# Patient Record
Sex: Female | Born: 1945 | Smoking: Former smoker
Health system: Southern US, Community
[De-identification: ages and names within clinical notes are randomized; demographics above are authoritative.]

## PROBLEM LIST (undated history)

## (undated) DIAGNOSIS — K209 Esophagitis, unspecified without bleeding: Secondary | ICD-10-CM

## (undated) DIAGNOSIS — Z86010 Personal history of colonic polyps: Secondary | ICD-10-CM

## (undated) DIAGNOSIS — H269 Unspecified cataract: Secondary | ICD-10-CM

## (undated) DIAGNOSIS — K5790 Diverticulosis of intestine, part unspecified, without perforation or abscess without bleeding: Secondary | ICD-10-CM

## (undated) DIAGNOSIS — K219 Gastro-esophageal reflux disease without esophagitis: Secondary | ICD-10-CM

## (undated) DIAGNOSIS — K649 Unspecified hemorrhoids: Secondary | ICD-10-CM

## (undated) DIAGNOSIS — K449 Diaphragmatic hernia without obstruction or gangrene: Secondary | ICD-10-CM

## (undated) HISTORY — DX: Unspecified cataract: H26.9

## (undated) HISTORY — PX: APPENDECTOMY: SHX54

## (undated) HISTORY — DX: Diverticulosis of intestine, part unspecified, without perforation or abscess without bleeding: K57.90

## (undated) HISTORY — DX: Personal history of colonic polyps: Z86.010

## (undated) HISTORY — PX: UPPER GASTROINTESTINAL ENDOSCOPY: SHX188

## (undated) HISTORY — DX: Unspecified hemorrhoids: K64.9

## (undated) HISTORY — DX: Diaphragmatic hernia without obstruction or gangrene: K44.9

## (undated) HISTORY — PX: TONSILLECTOMY AND ADENOIDECTOMY: SUR1326

## (undated) HISTORY — DX: Gastro-esophageal reflux disease without esophagitis: K21.9

## (undated) HISTORY — DX: Esophagitis, unspecified: K20.9

## (undated) HISTORY — DX: Esophagitis, unspecified without bleeding: K20.90

---

## 2011-04-20 ENCOUNTER — Encounter: Payer: Self-pay | Admitting: Internal Medicine

## 2011-04-25 ENCOUNTER — Encounter: Payer: Self-pay | Admitting: Internal Medicine

## 2011-04-25 ENCOUNTER — Ambulatory Visit (INDEPENDENT_AMBULATORY_CARE_PROVIDER_SITE_OTHER): Payer: Medicare Other | Admitting: Internal Medicine

## 2011-04-25 DIAGNOSIS — R1310 Dysphagia, unspecified: Secondary | ICD-10-CM

## 2011-04-25 DIAGNOSIS — R1013 Epigastric pain: Secondary | ICD-10-CM

## 2011-04-25 DIAGNOSIS — Z86010 Personal history of colonic polyps: Secondary | ICD-10-CM

## 2011-04-25 DIAGNOSIS — K219 Gastro-esophageal reflux disease without esophagitis: Secondary | ICD-10-CM

## 2011-04-25 MED ORDER — PEG-KCL-NACL-NASULF-NA ASC-C 100 G PO SOLR: 1.0000 | Freq: Once | ORAL | Status: DC

## 2011-04-25 NOTE — Progress Notes (Signed)
HISTORY OF PRESENT ILLNESS:  Brianna Trujillo is a 65 y.o. female with no significant past medical history who presents today with new-onset swallowing issues and dyspeptic symptoms. Patient reports developing a "choking sensation" about 2 weeks ago. Associated with this was pyrosis and epigastric discomfort. She does have problems with heartburn intermittently, over the years, for which she takes an [place]a product or consumes celery. The pyrosis and epigastric discomfort have resolved. However, difficulties with initiating swallowing, have continued. She does say that she had similar problems years ago, and was told that she may be developing a goiter. She's also had upper endoscopy and colonoscopy approximately 8 years ago. We do not have copies of those reports. She is not sure why she underwent upper endoscopy, but cannot recall any abnormalities. She states that colonoscopy had colon polyp or polyps in that she is due for followup. She denies lower GI complaints. She has had no bleeding or weight loss. No family history of colon cancer.  REVIEW OF SYSTEMS:  All non-GI ROS negative entirely.  Past Medical History  Diagnosis Date  . History of colon polyps     Past Surgical History  Procedure Date  . Appendectomy   . Tonsillectomy and adenoidectomy     Social History Brianna Trujillo  reports that she has quit smoking. She does not have any smokeless tobacco history on file. She reports that she drinks alcohol. She reports that she does not use illicit drugs.  family history includes Breast cancer in an unspecified family member; Colon cancer in her paternal uncle; Colon polyps in her mother; Diabetes in her father; Heart disease in her father and mother; and Leukemia in her paternal uncle.  No Known Allergies     PHYSICAL EXAMINATION: Vital signs: BP 130/70  Pulse 80  Ht 5\' 3"  (1.6 m)  Wt 148 lb 3.2 oz (67.223 kg)  BMI 26.25 kg/m2  Constitutional: generally well-appearing, no  acute distress Psychiatric: alert and oriented x3, cooperative Eyes: extraocular movements intact, anicteric, conjunctiva pink Mouth: oral pharynx moist, no lesions Neck: supple no lymphadenopathy Cardiovascular: heart regular rate and rhythm, no murmur Lungs: clear to auscultation bilaterally Abdomen: soft, nontender, nondistended, no obvious ascites, no peritoneal signs, normal bowel sounds, no organomegaly Rectal: Deferred until colonoscopy Extremities: no lower extremity edema bilaterally Skin: no lesions on visible extremities Neuro: No focal deficits.   ASSESSMENT:  #1. Vague complaints of swallowing difficulty. Rule out anatomic abnormality. #2. GERD #3. Transient epigastric discomfort likely related to GERD #4. History of colon polyps (type unknown). Exam at least 8 years ago. Reports to be due for followup.   PLAN:   #1. Obtain outside records for review, if possible #2. Schedule upper endoscopy to evaluate vague dysphagia and colonoscopy to provide colon polyp surveillance.The nature of the procedure, as well as the risks, benefits, and alternatives were carefully and thoroughly reviewed with the patient. Ample time for discussion and questions allowed. The patient understood, was satisfied, and agreed to proceed. Movi prep prescribed. The patient instructed on its use #3. Reflux precautions and reflux diet. Literature provided as well as brochures. #4. Discussed the role of on demand PPI for stubborn heartburn.

## 2011-04-25 NOTE — Patient Instructions (Signed)
Colon/endo [facility] 05/09/11 3:30 pm arrive at 2:30 pm on 4th floor Moviprep sent to pharmacy Colon/endo brochure given to you to read.

## 2011-04-28 HISTORY — PX: COLONOSCOPY: SHX174

## 2011-05-09 ENCOUNTER — Ambulatory Visit (AMBULATORY_SURGERY_CENTER): Payer: Medicare Other | Admitting: Internal Medicine

## 2011-05-09 ENCOUNTER — Encounter: Payer: Self-pay | Admitting: Internal Medicine

## 2011-05-09 VITALS — BP 131/72 | HR 77 | Temp 97.4°F | Resp 21 | Ht 64.0 in | Wt 143.0 lb

## 2011-05-09 DIAGNOSIS — R1310 Dysphagia, unspecified: Secondary | ICD-10-CM

## 2011-05-09 DIAGNOSIS — Z1211 Encounter for screening for malignant neoplasm of colon: Secondary | ICD-10-CM

## 2011-05-09 DIAGNOSIS — R1013 Epigastric pain: Secondary | ICD-10-CM

## 2011-05-09 DIAGNOSIS — K219 Gastro-esophageal reflux disease without esophagitis: Secondary | ICD-10-CM

## 2011-05-09 DIAGNOSIS — Z86010 Personal history of colonic polyps: Secondary | ICD-10-CM

## 2011-05-09 MED ORDER — SODIUM CHLORIDE 0.9 % IV SOLN: 500.0000 mL | INTRAVENOUS | Status: DC

## 2011-05-09 MED ORDER — OMEPRAZOLE 40 MG PO CPDR: 40.0000 mg | DELAYED_RELEASE_CAPSULE | Freq: Every day | ORAL | Status: DC

## 2011-05-09 NOTE — Patient Instructions (Addendum)
Please refer to your blue and neon green sheets for instructions regarding diet and activity for the rest of today.  You may resume your medications as you would normally take them. Your omeprazole prescription was sent in to your pharmacy.  Hemorrhoids Hemorrhoids are dilated (enlarged) veins around the rectum. Sometimes clots will form in the veins. This makes them swollen and painful. These are called thrombosed hemorrhoids. Causes of hemorrhoids include:  Pregnancy: this increases the pressure in the hemorrhoidal veins.   Constipation.   Straining to have a bowel movement.  HOME CARE INSTRUCTIONS  Eat a well balanced diet and drink 6 to 8 glasses of water every day to avoid constipation. You may also use a bulk laxative.   Avoid straining to have bowel movements.   Keep anal area dry and clean.   Only take over-the-counter or prescription medicines for pain, discomfort, or fever as directed by your caregiver.  If thrombosed:  Take hot sitz baths for 20 to 30 minutes, 3 to 4 times per day.   If the hemorrhoids are very tender and swollen, place ice packs on area as tolerated. Using ice packs between sitz baths may be helpful. Fill a plastic bag with ice and use a towel between the bag of ice and your skin.   Special creams and suppositories (Anusol, Nupercainal, Wyanoids) may be used or applied as directed.   Do not use a donut shaped pillow or sit on the toilet for long periods. This increases blood pooling and pain.   Move your bowels when your body has the urge; this will require less straining and will decrease pain and pressure.   Only take over-the-counter or prescription medicines for pain, discomfort, or fever as directed by your caregiver.  SEEK MEDICAL CARE IF:  You have increasing pain and swelling that is not controlled with your prescription.   You have uncontrolled bleeding.   You have an inability or difficulty having a bowel movement.   You have pain or  inflammation outside the area of the hemorrhoids.   You have chills and/or an increased oral temperature that lasts for 2 days or longer, or as your caregiver suggests.  MAKE SURE YOU:   Understand these instructions.   Will watch your condition.   Will get help right away if you are not doing well or get worse.  Document Released: 08/10/2000 Document Re-Released: 07/26/2008 [facility] Patient Information 2011 [place], [place].   Diverticulosis Diverticulosis is a common condition that develops when small pouches (diverticula) form in the wall of the colon. The risk of diverticulosis increases with age. It happens more often in people who eat a low-fiber diet. Most individuals with diverticulosis have no symptoms. Those individuals with symptoms usually experience belly (abdominal) pain, constipation, or loose stools (diarrhea). HOME CARE INSTRUCTIONS  Increase the amount of fiber in your diet as directed by your caregiver or dietician. This may reduce symptoms of diverticulosis.   Your caregiver may recommend taking a dietary fiber supplement.   Drink at least 6 to 8 glasses of water each day to prevent constipation.   Try not to strain when you have a bowel movement.   Your caregiver may recommend avoiding nuts and seeds to prevent complications, although this is still an uncertain benefit.   Only take over-the-counter or prescription medicines for pain, discomfort, or fever as directed by your caregiver.  FOODS HAVING HIGH FIBER CONTENT INCLUDE:  Fruits. Apple, peach, pear, tangerine, raisins, prunes.   Vegetables. Brussels sprouts, asparagus,  broccoli, cabbage, carrot, cauliflower, romaine lettuce, spinach, summer squash, tomato, winter squash, zucchini.   Starchy Vegetables. Baked beans, kidney beans, lima beans, split peas, lentils, potatoes (with skin).   Grains. Whole wheat bread, brown rice, bran flake cereal, plain oatmeal, white rice, shredded wheat, bran muffins.  SEEK  IMMEDIATE MEDICAL CARE IF:  You develop increasing pain or severe bloating.   You have an increased oral temperature, not controlled by medicine.   You develop vomiting or bowel movements that are bloody or black.  Document Released: 05/10/2004 Document Re-Released: 01/31/2010 [facility] Patient Information 2011 [place], [place].  Hiatal Hernia A hiatal hernia occurs when a part of the stomach slides above the diaphragm. The diaphragm is the thin muscle separating the belly (abdomen) from the chest. A hiatal hernia can be something you are born with or develop over time. Hiatal hernias may allow stomach acid to flow back into your esophagus, the tube which carries food from your mouth to your stomach. If this acid causes problems it is called GERD (gastro-esophageal reflux disease).  SYMPTOMS Common symptoms of GERD are heartburn (burning in your chest). This is worse when lying down or bending over. It may also cause belching and indigestion. Some of the things which make GERD worse are:  Increased weight pushes on stomach making acid rise more easily.   Smoking markedly increases acid production.   Alcohol decreases lower esophageal sphincter pressure (valve between stomach and esophagus), allowing acid from stomach into esophagus.   Late evening meals and going to bed with a full stomach increases pressure.   Anything that causes an increase in acid production.   Lower esophageal sphincter incompetence.  DIAGNOSIS Hiatal hernia is often diagnosed with x-rays of your stomach and small bowel. This is called an UGI (upper gastrointestinal x-ray). Sometimes a gastroscopic procedure is done. This is a procedure where your caregiver uses a flexible instrument to look into the stomach and small bowel. HOME CARE INSTRUCTIONS  Try to achieve and maintain an ideal body weight.   Avoid drinking alcoholic beverages.   Stop smoking.   Put the head of your bed on 4 to 6 inch blocks. This will  keep your head and esophagus higher than your stomach. If you cannot use blocks, sleep with several pillows under your head and shoulders.   Over-the-counter medications will decrease acid production. Your caregiver can also prescribe medications for this. Take as directed.   1/2 to 1 teaspoon of an antacid taken every hour while awake, with meals and at bedtime, will neutralize acid.   DO NOT take aspirin, ibuprofen (Advil or Motrin), or other nonsteroidal anti-inflammatory drugs.   Do not wear tight clothing around your chest or stomach.   Eat smaller meals and eat more frequently. This keeps your stomach from getting too full. Eat slowly.   Do not lie down for 2 or 3 hours after eating. Do not eat or drink anything 1 to 2 hours before going to bed.   Avoid caffeine beverages (colas, coffee, cocoa, tea), fatty foods, citrus fruits and all other foods and drinks that contain acid and that seem to increase the problems.   Avoid bending over, especially after eating. Also avoid straining during bowel movements or when urinating or lifting things. Anything that increases the pressure in your belly increases the amount of acid that may be pushed up into your esophagus.  SEEK IMMEDIATE MEDICAL ATTENTION IF:  There is change in location (pain in arms, neck, jaw, teeth or back)  of your pain, or the pain is getting worse.   You also experience nausea, vomiting, sweating (diaphoresis), or shortness of breath.   You develop continual vomiting, vomit blood or coffee ground material, have bright red blood in your stools, or have black tarry stools.  Some of these symptoms could signal other problems such as heart disease. MAKE SURE YOU:   Understand these instructions.   Monitor your condition.   Contact your caregiver if you are not doing well or are getting worse.  Document Released: 11/03/2003 Document Re-Released: 11/09/2008 [facility] Patient Information 2011 [place], [place].

## 2011-05-10 ENCOUNTER — Telehealth: Payer: Self-pay | Admitting: *Deleted

## 2011-05-10 NOTE — Telephone Encounter (Signed)

## 2011-06-20 ENCOUNTER — Ambulatory Visit: Payer: No Typology Code available for payment source | Admitting: Internal Medicine

## 2015-07-20 ENCOUNTER — Encounter: Payer: Self-pay | Admitting: Internal Medicine

## 2015-09-23 ENCOUNTER — Ambulatory Visit (INDEPENDENT_AMBULATORY_CARE_PROVIDER_SITE_OTHER): Payer: Medicare Other | Admitting: Internal Medicine

## 2015-09-23 ENCOUNTER — Encounter: Payer: Self-pay | Admitting: Internal Medicine

## 2015-09-23 VITALS — BP 122/74 | HR 84 | Ht 63.0 in | Wt 148.0 lb

## 2015-09-23 DIAGNOSIS — K2100 Gastro-esophageal reflux disease with esophagitis, without bleeding: Secondary | ICD-10-CM

## 2015-09-23 DIAGNOSIS — R1013 Epigastric pain: Secondary | ICD-10-CM

## 2015-09-23 NOTE — Progress Notes (Signed)
HISTORY OF PRESENT ILLNESS:  Brianna Trujillo is a 71 y.o. female who is self-referred today regarding recent problems with upper abdominal pain. She was initially evaluated 04/25/2011 regarding GERD, vague complaints of swallowing difficulty, and transient epigastric discomfort. She also reported a history of colon polyps and the need for surveillance. Thus, on 05/09/2011 she underwent colonoscopy and upper endoscopy. Complete colonoscopy revealed mild sigmoid diverticulosis and internal hemorrhoids but was otherwise normal. Follow-up in 10 years recommended. Upper endoscopy revealed endoscopic evidence of mild esophagitis and small hiatal hernia. Omeprazole prescribed. Follow-up in 6 weeks recommended, but the patient did not. She has not been seen since her procedures. At some point she went off PPI therapy stating that she prefers "natural remedies". Seemingly doing okay until approximately 6 or 8 weeks ago when she developed severe epigastric pain with radiation into the right upper quadrant and chest. This lasted approximate 2 hours. She states that she has similar, much less severe, episodes previously. She did describe a epigastric gnawing discomfort for about one week thereafter. She denies dysphagia but does note globus type sensation. Also reports abdominal bloating. There has been no weight loss or GI bleeding. No jaundice. No back pain.  REVIEW OF SYSTEMS:  All non-GI ROS negative upon comprehensive review  Past Medical History  Diagnosis Date  . History of colon polyps   . GERD (gastroesophageal reflux disease)   . Hiatal hernia   . Esophagitis   . Diverticulosis   . Hemorrhoids     Past Surgical History  Procedure Laterality Date  . Appendectomy    . Tonsillectomy and adenoidectomy      Social History Brianna Trujillo  reports that she has quit smoking. She does not have any smokeless tobacco history on file. She reports that she drinks alcohol. She reports that she does not use  illicit drugs.  family history includes Colon cancer in her paternal uncle; Colon polyps in her mother; Diabetes in her father; Heart disease in her father and mother; Leukemia in her paternal uncle.  No Known Allergies     PHYSICAL EXAMINATION: Vital signs: BP 122/74 mmHg  Pulse 84  Ht  (1.6 m)  Wt 148 lb (67.132 kg)  BMI 26.22 kg/m2  Constitutional: Doesn't, generally well-appearing, no acute distress Psychiatric: alert and oriented x3, cooperative Eyes: extraocular movements intact, anicteric, conjunctiva pink Mouth: oral pharynx moist, no lesions Neck: supple no lymphadenopathy Cardiovascular: heart regular rate and rhythm, no murmur Lungs: clear to auscultation bilaterally Abdomen: soft, nontender, nondistended, no obvious ascites, no peritoneal signs, normal bowel sounds, no organomegaly Rectal: Ommitted Extremities: no clubbing cyanosis or lower extremity edema bilaterally Skin: no lesions on visible extremities Neuro: No focal deficits. Normal deep tendon reflexes    ASSESSMENT:  #1. Problems with epigastric discomfort as described. Rule out gallstones. Rule out exacerbation of GERD   PLAN:  #1. Abdominal ultrasound rule out gallstones and further evaluate pain #2. Gallstones present, surgical consultation #3. If no gallstones, then discomfort may be related to known GERD. #4. Recommend on demand omeprazole in addition to "natural remedies" for epigastric discomfort as needed if ultrasound negative #5. Colon cancer screening up-to-date. Routine follow-up 2022 6. Interval GI follow-up as needed. She agrees to follow-up for problems as needed

## 2015-09-23 NOTE — Patient Instructions (Signed)
You have been scheduled for an abdominal ultrasound at [facility] (1st floor of hospital) on 09/28/2015 at 9:30am. Please arrive 15 minutes prior to your appointment for registration. Make certain not to have anything to eat or drink 6 hours prior to your appointment. Should you need to reschedule your appointment, please contact radiology at [phone]. This test typically takes about 30 minutes to perform.

## 2015-09-28 ENCOUNTER — Ambulatory Visit (HOSPITAL_COMMUNITY)
Admission: RE | Admit: 2015-09-28 | Discharge: 2015-09-28 | Disposition: A | Discharge status: Home / Self Care | Payer: Medicare Other | Source: Ambulatory Visit | Attending: Internal Medicine | Admitting: Internal Medicine

## 2015-09-28 DIAGNOSIS — R1013 Epigastric pain: Secondary | ICD-10-CM | POA: Insufficient documentation

## 2015-09-28 DIAGNOSIS — R109 Unspecified abdominal pain: Secondary | ICD-10-CM | POA: Diagnosis present

## 2016-03-01 ENCOUNTER — Telehealth: Payer: Self-pay | Admitting: Internal Medicine

## 2016-03-01 NOTE — Telephone Encounter (Signed)
Noted  

## 2016-03-01 NOTE — Telephone Encounter (Signed)
FYI Dr. Marina Trujillo   Patient does not agree with Dr. Lamar SprinklesPerry's assessment of her epigastric pain.  She is convinced that her pain is from a gallstone in her gallbladder.  I reviewed with her again the results of the US from 09/28/15 and his recommendations.  She completely rejects the assessment that her pain is from GERD and feels her pain is from her gallbladder.  She declines to schedule an additional appt to discuss symptoms or that she should start Prilosec OTC as recommended.  Patient thanked me for the call, but before she did she advised me that she will have to find someone else that will address her gallbladder.

## 2017-01-28 DIAGNOSIS — N819 Female genital prolapse, unspecified: Secondary | ICD-10-CM | POA: Insufficient documentation

## 2017-01-28 DIAGNOSIS — N952 Postmenopausal atrophic vaginitis: Secondary | ICD-10-CM | POA: Insufficient documentation

## 2017-07-12 IMAGING — US US ABDOMEN COMPLETE
1 series · 14 of 25 positions shown · non-contrast
Comparison: None.

CLINICAL DATA: Abdominal pain.

EXAM:
ABDOMEN ULTRASOUND COMPLETE

[Series 1: us abdomen complete · 0.17mm/px · 14 of 112 slices shown]
[im 1/112]
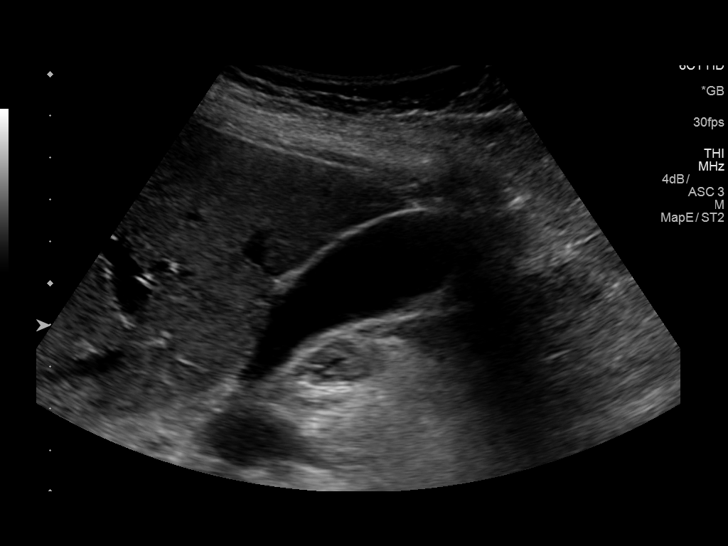
[im 10/112]
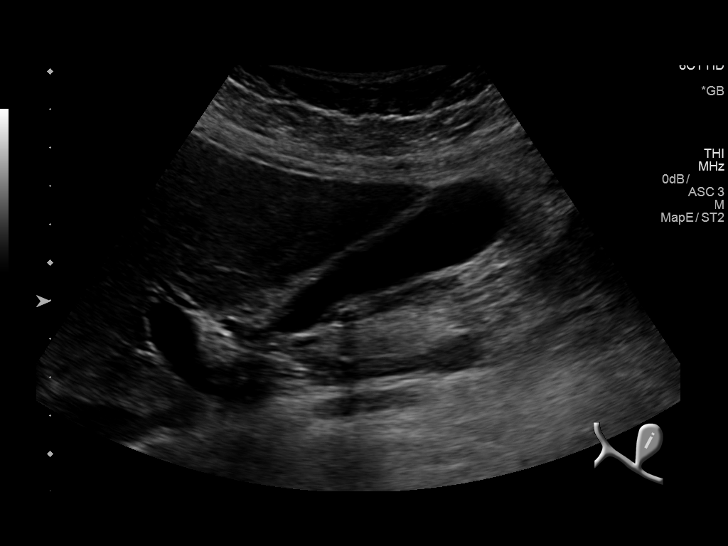
[im 19/112]
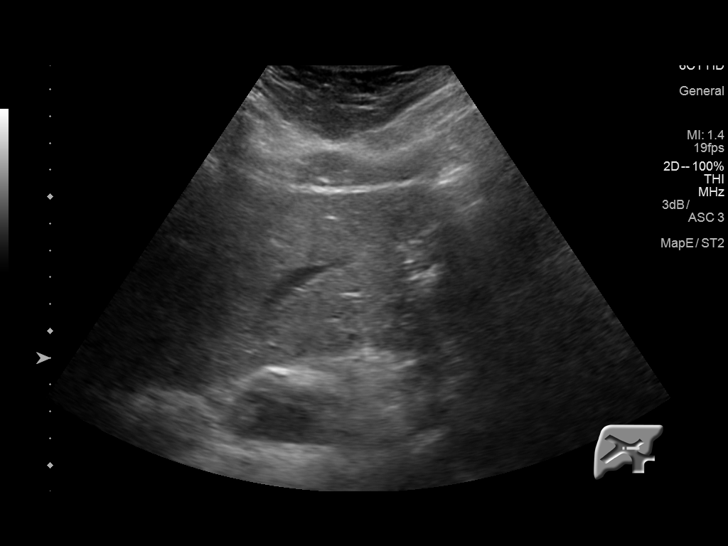
[im 28/112]
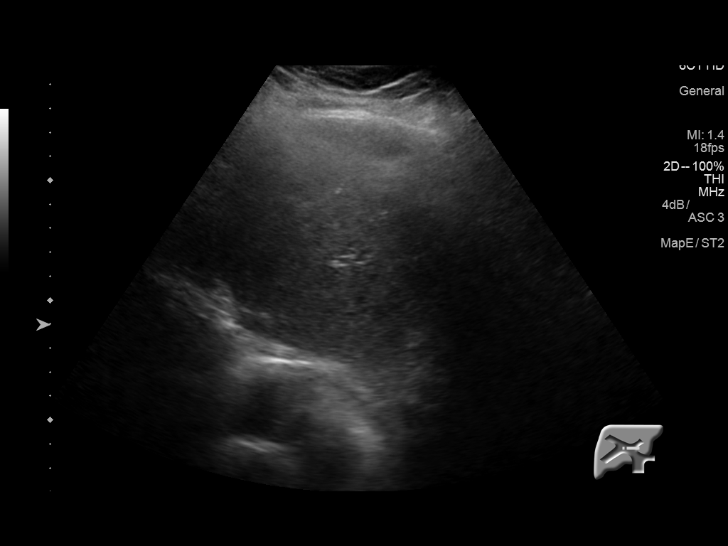
[im 38/112]
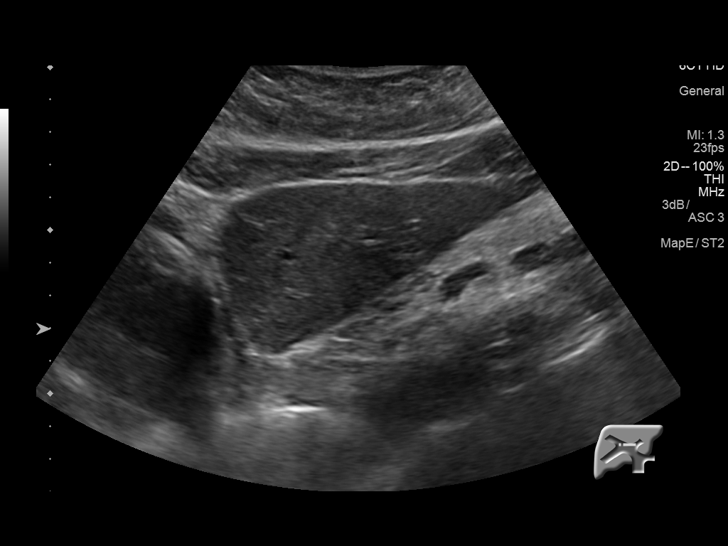
[im 42/112]
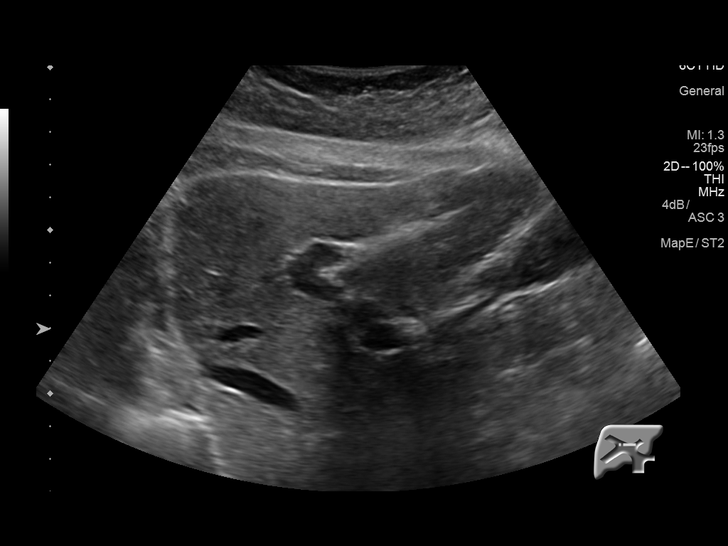
[im 51/112]
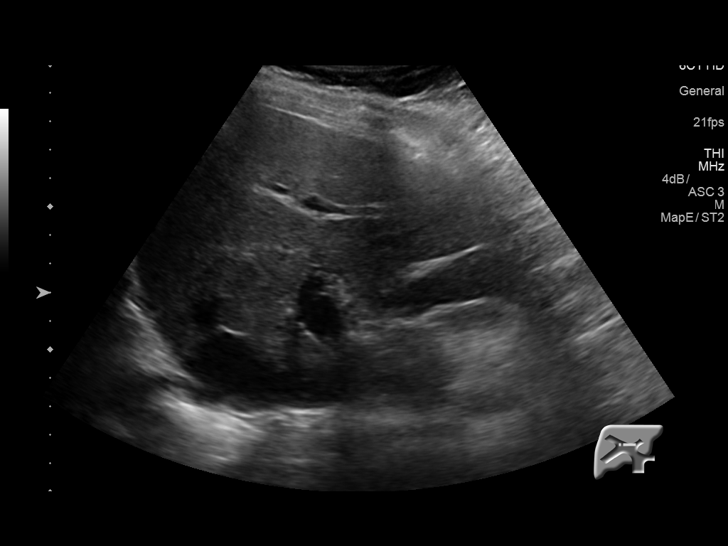
[im 61/112]
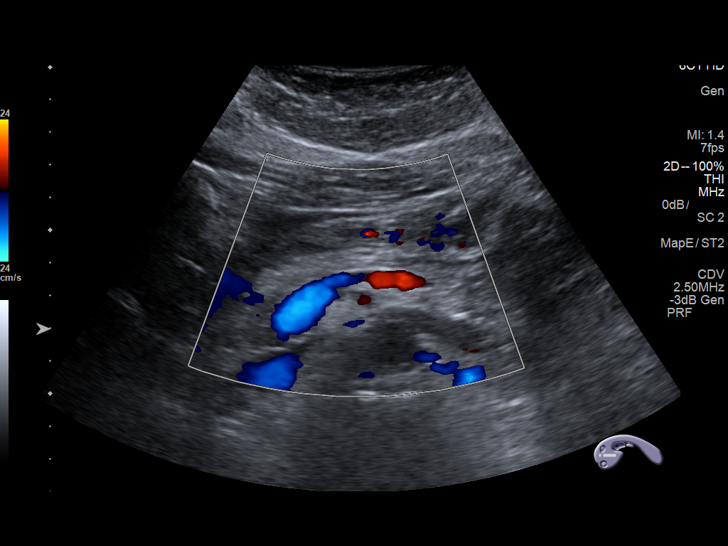
[im 70/112]
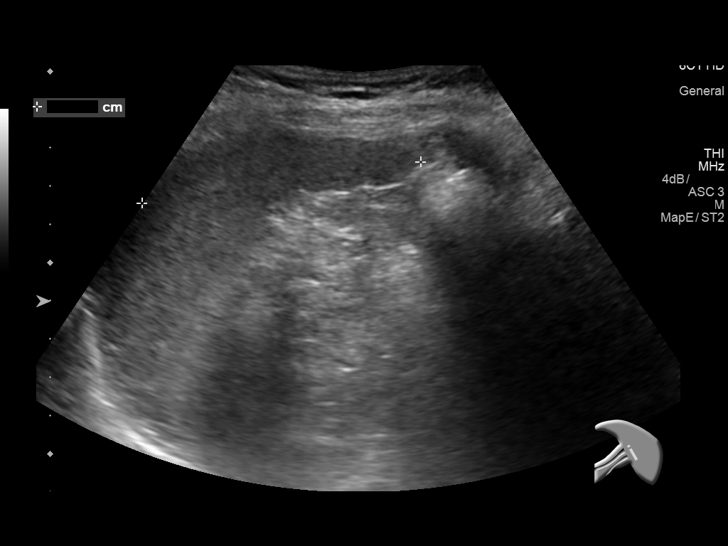
[im 75/112]
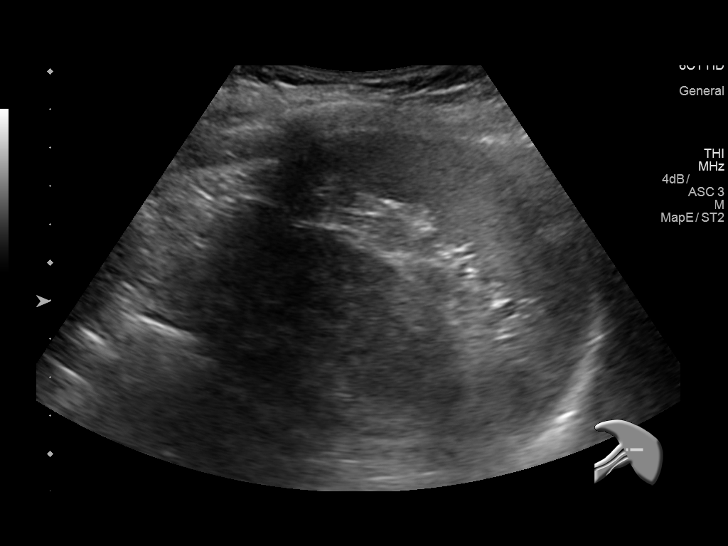
[im 84/112]
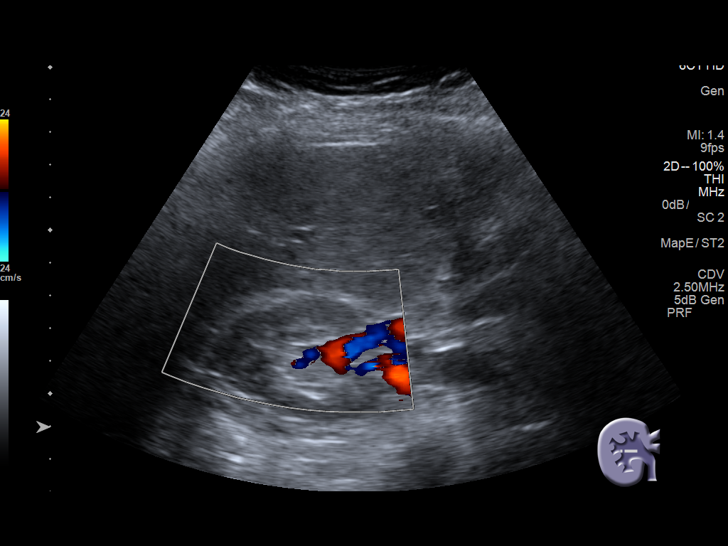
[im 93/112]
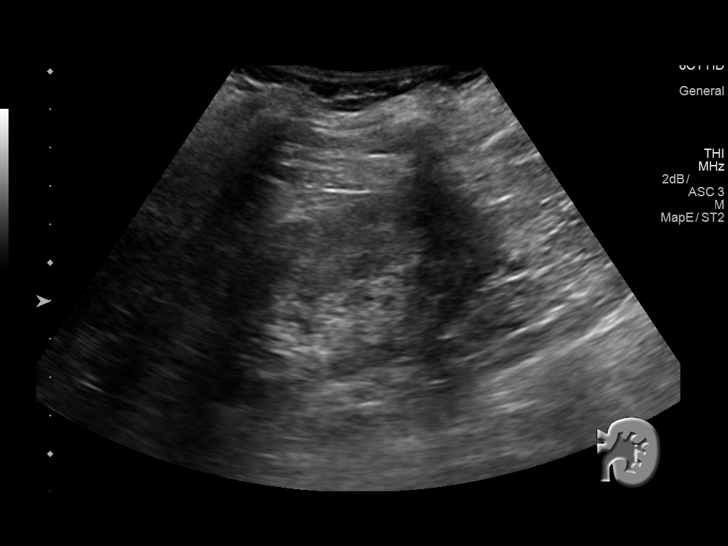
[im 102/112]
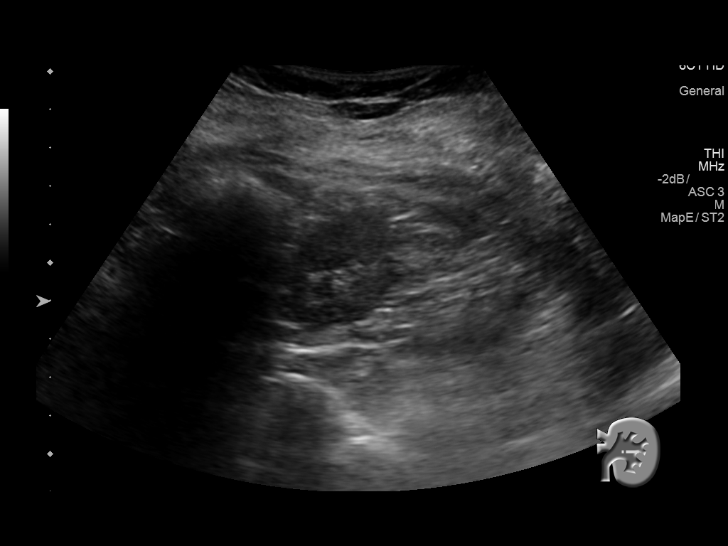
[im 112/112]
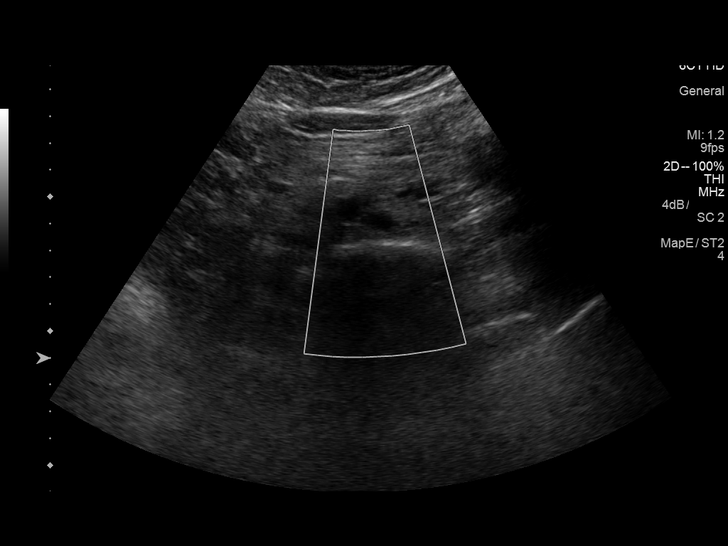

[14 of 25 positions shown; findings below may reference images not displayed]

FINDINGS: Gallbladder: No gallstones or wall thickening visualized. No
sonographic Murphy sign noted by sonographer.

Common bile duct: Diameter: 2.4 mm

Liver: No focal lesion identified. Within normal limits in
parenchymal echogenicity.

IVC: No abnormality visualized.

Pancreas: Visualized portion unremarkable.

Spleen: Size and appearance within normal limits.

Right Kidney: Length: 10.2 cm. Echogenicity within normal limits. No
mass or hydronephrosis visualized.

Left Kidney: Length: 10.8 cm. Echogenicity within normal limits. No
mass or hydronephrosis visualized.

Abdominal aorta: No aneurysm visualized.

Other findings: None.
IMPRESSION: Negative exam.

## 2021-02-24 HISTORY — PX: CATARACT EXTRACTION, BILATERAL: SHX1313

## 2021-05-23 ENCOUNTER — Encounter: Payer: Self-pay | Admitting: Internal Medicine

## 2022-12-26 ENCOUNTER — Telehealth: Payer: Self-pay | Admitting: Internal Medicine

## 2022-12-26 NOTE — Telephone Encounter (Signed)
Spoke with pt and she is wanting to schedule her colon. She was due for a recall in 2022. Reports she is not having any issues. She is aware Dr. Marina Goodell is out of the office and we can get back to her upon his return to the office.

## 2022-12-26 NOTE — Telephone Encounter (Signed)
Inbound call from patient wanting to schedule colon procedure. Please advise on scheduling. 

## 2023-01-07 NOTE — Telephone Encounter (Signed)
See note below from Dr. Marina Goodell. Pt needs previsit and colon. OK to schedule.

## 2023-01-07 NOTE — Telephone Encounter (Signed)
Okay to schedule direct colonoscopy in [facility]. However, she should see previous nurse prior.

## 2023-01-17 ENCOUNTER — Encounter: Payer: Self-pay | Admitting: Internal Medicine

## 2023-02-10 ENCOUNTER — Telehealth: Payer: Self-pay

## 2023-02-10 NOTE — Telephone Encounter (Signed)
Dr. Marina Goodell, This patient is currently scheduled for a PV in [facility] on 02/19/2023 and a colon on  03/07/2023. However, the colon recall assessment sheet was completed on 02/2021 an the patient is age 77 at this time.   Does this patient need to have her colonoscopy or is she no longer required due to age? Please advise Thank you Bre

## 2023-02-11 NOTE — Telephone Encounter (Signed)
Yes, proceed with previsit. If she is in good health, she would be a good candidate for screening colonoscopy as her last was greater than 10 years ago Thanks, Dr. Marina Goodell

## 2023-02-11 NOTE — Telephone Encounter (Signed)
Please note this on the patient's PV chart-thank you Bre

## 2023-02-19 ENCOUNTER — Ambulatory Visit (AMBULATORY_SURGERY_CENTER): Payer: Medicare Other

## 2023-02-19 VITALS — Ht 63.0 in | Wt 147.0 lb

## 2023-02-19 DIAGNOSIS — Z1211 Encounter for screening for malignant neoplasm of colon: Secondary | ICD-10-CM

## 2023-02-19 MED ORDER — NA SULFATE-K SULFATE-MG SULF 17.5-3.13-1.6 GM/177ML PO SOLN: 1.0000 | Freq: Once | ORAL | 0 refills | Status: AC

## 2023-02-19 NOTE — Progress Notes (Addendum)
No egg or soy allergy known to patient   No issues known to pt with past sedation with any surgeries or procedures  Patient denies ever being told they had issues or difficulty with intubation   No FH of Malignant Hyperthermia  Pt is not on diet pills  Pt is not on  home 02   Pt is not on blood thinners   Pt denies issues with constipation   No A fib or A flutter  Have any cardiac testing pending--no  Pt instructed to use [email or web address][email or web address] or GoodRx for a price reduction on prep   Mobility not an issue per pt  Patient's chart reviewed by Brianna Nulty CRNA prior to previsit and patient appropriate for the [facility].  Previsit completed and red dot placed by patient's name on their procedure day (on provider's schedule).     

## 2023-03-07 ENCOUNTER — Encounter: Payer: Self-pay | Admitting: Internal Medicine

## 2023-03-07 ENCOUNTER — Ambulatory Visit (AMBULATORY_SURGERY_CENTER): Payer: Medicare Other | Admitting: Internal Medicine

## 2023-03-07 VITALS — BP 130/61 | HR 71 | Temp 97.5°F | Resp 18 | Ht 63.0 in | Wt 147.0 lb

## 2023-03-07 DIAGNOSIS — Z1211 Encounter for screening for malignant neoplasm of colon: Secondary | ICD-10-CM | POA: Diagnosis not present

## 2023-03-07 DIAGNOSIS — D128 Benign neoplasm of rectum: Secondary | ICD-10-CM

## 2023-03-07 DIAGNOSIS — K621 Rectal polyp: Secondary | ICD-10-CM | POA: Diagnosis not present

## 2023-03-07 MED ORDER — SODIUM CHLORIDE 0.9 % IV SOLN: 500.0000 mL | Freq: Once | INTRAVENOUS | Status: DC

## 2023-03-07 NOTE — Op Note (Signed)
[facility] Patient Name: Brianna Trujillo Procedure Date: 03/07/2023 12:34 PM MRN: [id number] Endoscopist: Wilhemina Bonito. Marina Goodell , MD, [id number] Age: 77 Referring MD:  Date of Birth: 1946/08/11 Gender: Female Account #: [id number] Procedure:                Colonoscopy with cold snare polypectomy x 1 Indications:              Screening for colorectal malignant neoplasm.                            Previous examination 2012 was negative for neoplasia Medicines:                Monitored Anesthesia Care Procedure:                Pre-Anesthesia Assessment:                           - Prior to the procedure, a History and Physical                            was performed, and patient medications and                            allergies were reviewed. The patient's tolerance of                            previous anesthesia was also reviewed. The risks                            and benefits of the procedure and the sedation                            options and risks were discussed with the patient.                            All questions were answered, and informed consent                            was obtained. Prior Anticoagulants: The patient has                            taken no anticoagulant or antiplatelet agents. ASA                            Grade Assessment: II - A patient with mild systemic                            disease. After reviewing the risks and benefits,                            the patient was deemed in satisfactory condition to                            undergo the procedure.  After obtaining informed consent, the colonoscope                            was passed under direct vision. Throughout the                            procedure, the patient's blood pressure, pulse, and                            oxygen saturations were monitored continuously. The                            CF [id number] #[id number] was introduced through the anus                             and advanced to the the cecum, identified by                            appendiceal orifice and ileocecal valve. The                            ileocecal valve, appendiceal orifice, and rectum                            were photographed. The quality of the bowel                            preparation was excellent. The colonoscopy was                            performed without difficulty. The patient tolerated                            the procedure well. The bowel preparation used was                            SUPREP via split dose instruction. Scope In: 1:31:39 PM Scope Out: 1:43:44 PM Scope Withdrawal Time: 0 hours 9 minutes 13 seconds  Total Procedure Duration: 0 hours 12 minutes 5 seconds  Findings:                 A 2 mm polyp was found in the rectum. The polyp was                            sessile. The polyp was removed with a cold snare.                            Resection and retrieval were complete.                           Multiple diverticula were found in the sigmoid                            colon.  External and internal hemorrhoids were found during                            retroflexion. The hemorrhoids were moderate.                           The exam was otherwise without abnormality on                            direct and retroflexion views. Complications:            No immediate complications. Estimated blood loss:                            None. Estimated Blood Loss:     Estimated blood loss: none. Impression:               - One 2 mm polyp in the rectum, removed with a cold                            snare. Resected and retrieved.                           - Diverticulosis in the sigmoid colon.                           - External and internal hemorrhoids.                           - The examination was otherwise normal on direct                            and retroflexion views. Recommendation:            - Repeat colonoscopy is not recommended for                            surveillance.                           - Patient has a contact number available for                            emergencies. The signs and symptoms of potential                            delayed complications were discussed with the                            patient. Return to normal activities tomorrow.                            Written discharge instructions were provided to the                            patient.                           -  Resume previous diet.                           - Continue present medications.                           - Await pathology results. Wilhemina Bonito. Marina Goodell, MD 03/07/2023 1:48:36 PM This report has been signed electronically.

## 2023-03-07 NOTE — Patient Instructions (Signed)
Resume previous diet and medications. Awaiting pathology results. Handouts provided on colon polyps, diverticulosis and hemorrhoids.  YOU HAD AN ENDOSCOPIC PROCEDURE TODAY AT THE [facility]:   Refer to the procedure report that was given to you for any specific questions about what was found during the examination.  If the procedure report does not answer your questions, please call your gastroenterologist to clarify.  If you requested that your care partner not be given the details of your procedure findings, then the procedure report has been included in a sealed envelope for you to review at your convenience later.  YOU SHOULD EXPECT: Some feelings of bloating in the abdomen. Passage of more gas than usual.  Walking can help get rid of the air that was put into your GI tract during the procedure and reduce the bloating. If you had a lower endoscopy (such as a colonoscopy or flexible sigmoidoscopy) you may notice spotting of blood in your stool or on the toilet paper. If you underwent a bowel prep for your procedure, you may not have a normal bowel movement for a few days.  Please Note:  You might notice some irritation and congestion in your nose or some drainage.  This is from the oxygen used during your procedure.  There is no need for concern and it should clear up in a day or so.  SYMPTOMS TO REPORT IMMEDIATELY:  Following lower endoscopy (colonoscopy or flexible sigmoidoscopy):  Excessive amounts of blood in the stool  Significant tenderness or worsening of abdominal pains  Swelling of the abdomen that is new, acute  Fever of 100F or higher   For urgent or emergent issues, a gastroenterologist can be reached at any hour by calling [phone]. Do not use MyChart messaging for urgent concerns.    DIET:  We do recommend a small meal at first, but then you may proceed to your regular diet.  Drink plenty of fluids but you should avoid alcoholic beverages for 24  hours.  ACTIVITY:  You should plan to take it easy for the rest of today and you should NOT DRIVE or use heavy machinery until tomorrow (because of the sedation medicines used during the test).    FOLLOW UP: Our staff will call the number listed on your records the next business day following your procedure.  We will call around 7:15- 8:00 am to check on you and address any questions or concerns that you may have regarding the information given to you following your procedure. If we do not reach you, we will leave a message.     If any biopsies were taken you will be contacted by phone or by letter within the next 1-3 weeks.  Please call us at [phone] if you have not heard about the biopsies in 3 weeks.    SIGNATURES/CONFIDENTIALITY: You and/or your care partner have signed paperwork which will be entered into your electronic medical record.  These signatures attest to the fact that that the information above on your After Visit Summary has been reviewed and is understood.  Full responsibility of the confidentiality of this discharge information lies with you and/or your care-partner.

## 2023-03-07 NOTE — Progress Notes (Signed)
HISTORY OF PRESENT ILLNESS:  Brianna Trujillo is a 77 y.o. female presents today for screening colonoscopy.  Previous exam 2012 was negative for neoplasia  REVIEW OF SYSTEMS:  All non-GI ROS negative.   Past Medical History:  Diagnosis Date   Cataract    bilateral removed   Diverticulosis    Esophagitis    GERD (gastroesophageal reflux disease)    Hemorrhoids    Hiatal hernia    History of colon polyps     Past Surgical History:  Procedure Laterality Date   APPENDECTOMY     CATARACT EXTRACTION, BILATERAL Bilateral 02/2021   COLONOSCOPY  04/2011   TONSILLECTOMY AND ADENOIDECTOMY     UPPER GASTROINTESTINAL ENDOSCOPY      Social History Brianna Trujillo  reports that she quit smoking about 34 years ago. Her smoking use included cigarettes. She does not have any smokeless tobacco history on file. She reports current alcohol use of about 3.0 standard drinks of alcohol per week. She reports that she does not use drugs.  family history includes Breast cancer in an other family member; Colon cancer in her maternal uncle; Colon polyps in her maternal uncle, mother, and sister; Diabetes in her father; Heart disease in her father and mother; Leukemia in her paternal uncle.  No Known Allergies     PHYSICAL EXAMINATION: Vital signs: BP (!) 151/74   Pulse 78   Temp (!) 97.5 F (36.4 C)   Resp 18   Ht 5\' 3"  (1.6 m)   Wt 147 lb (66.7 kg)   SpO2 98%   BMI 26.04 kg/m  General: Well-developed, well-nourished, no acute distress HEENT: Sclerae are anicteric, conjunctiva pink. Oral mucosa intact Lungs: Clear Heart: Regular Abdomen: soft, nontender, nondistended, no obvious ascites, no peritoneal signs, normal bowel sounds. No organomegaly. Extremities: No edema Psychiatric: alert and oriented x3. Cooperative     ASSESSMENT:  Colon cancer screening   PLAN:  Screening colonoscopy

## 2023-03-07 NOTE — Progress Notes (Signed)
Report to PACU, RN, vss, BBS= Clear.  

## 2023-03-07 NOTE — Progress Notes (Signed)
Called to room to assist during endoscopic procedure.  Patient ID and intended procedure confirmed with present staff. Received instructions for my participation in the procedure from the performing physician.  

## 2023-03-08 ENCOUNTER — Telehealth: Payer: Self-pay | Admitting: *Deleted

## 2023-03-08 NOTE — Telephone Encounter (Signed)
No answer on follow up call. Not available

## 2023-03-12 ENCOUNTER — Encounter: Payer: Self-pay | Admitting: Internal Medicine
